# Patient Record
Sex: Female | Born: 1955 | Race: White | Hispanic: No | Marital: Married | Smoking: Former smoker
Health system: Southern US, Community
[De-identification: ages and names within clinical notes are randomized; demographics above are authoritative.]

## PROBLEM LIST (undated history)

## (undated) DIAGNOSIS — Z9109 Other allergy status, other than to drugs and biological substances: Secondary | ICD-10-CM

## (undated) DIAGNOSIS — S92909A Unspecified fracture of unspecified foot, initial encounter for closed fracture: Secondary | ICD-10-CM

## (undated) DIAGNOSIS — K649 Unspecified hemorrhoids: Secondary | ICD-10-CM

## (undated) DIAGNOSIS — E559 Vitamin D deficiency, unspecified: Secondary | ICD-10-CM

## (undated) DIAGNOSIS — B001 Herpesviral vesicular dermatitis: Secondary | ICD-10-CM

## (undated) DIAGNOSIS — O009 Unspecified ectopic pregnancy without intrauterine pregnancy: Secondary | ICD-10-CM

## (undated) HISTORY — DX: Other allergy status, other than to drugs and biological substances: Z91.09

## (undated) HISTORY — DX: Vitamin D deficiency, unspecified: E55.9

## (undated) HISTORY — DX: Unspecified ectopic pregnancy without intrauterine pregnancy: O00.90

## (undated) HISTORY — DX: Unspecified fracture of unspecified foot, initial encounter for closed fracture: S92.909A

## (undated) HISTORY — DX: Unspecified hemorrhoids: K64.9

## (undated) HISTORY — DX: Herpesviral vesicular dermatitis: B00.1

## (undated) HISTORY — PX: PELVIC LAPAROSCOPY: SHX162

---

## 1998-08-09 ENCOUNTER — Other Ambulatory Visit: Admission: RE | Admit: 1998-08-09 | Discharge: 1998-08-09 | Payer: Self-pay | Admitting: Obstetrics and Gynecology

## 1999-10-09 ENCOUNTER — Other Ambulatory Visit: Admission: RE | Admit: 1999-10-09 | Discharge: 1999-10-09 | Payer: Self-pay | Admitting: Obstetrics and Gynecology

## 2000-10-09 ENCOUNTER — Other Ambulatory Visit: Admission: RE | Admit: 2000-10-09 | Discharge: 2000-10-09 | Payer: Self-pay | Admitting: Obstetrics and Gynecology

## 2001-10-27 ENCOUNTER — Other Ambulatory Visit: Admission: RE | Admit: 2001-10-27 | Discharge: 2001-10-27 | Payer: Self-pay | Admitting: Obstetrics and Gynecology

## 2003-03-23 ENCOUNTER — Other Ambulatory Visit: Admission: RE | Admit: 2003-03-23 | Discharge: 2003-03-23 | Payer: Self-pay | Admitting: Obstetrics and Gynecology

## 2004-03-27 ENCOUNTER — Other Ambulatory Visit: Admission: RE | Admit: 2004-03-27 | Discharge: 2004-03-27 | Payer: Self-pay | Admitting: Obstetrics and Gynecology

## 2005-03-28 ENCOUNTER — Other Ambulatory Visit: Admission: RE | Admit: 2005-03-28 | Discharge: 2005-03-28 | Payer: Self-pay | Admitting: Obstetrics and Gynecology

## 2005-04-29 ENCOUNTER — Encounter: Admission: RE | Admit: 2005-04-29 | Discharge: 2005-04-29 | Payer: Self-pay | Admitting: Surgery

## 2005-07-29 ENCOUNTER — Encounter: Admission: RE | Admit: 2005-07-29 | Discharge: 2005-07-29 | Payer: Self-pay | Admitting: Surgery

## 2005-11-07 ENCOUNTER — Encounter: Admission: RE | Admit: 2005-11-07 | Discharge: 2005-11-07 | Payer: Self-pay | Admitting: Obstetrics and Gynecology

## 2006-05-12 ENCOUNTER — Other Ambulatory Visit: Admission: RE | Admit: 2006-05-12 | Discharge: 2006-05-12 | Payer: Self-pay | Admitting: Obstetrics and Gynecology

## 2006-05-14 ENCOUNTER — Encounter: Admission: RE | Admit: 2006-05-14 | Discharge: 2006-05-14 | Payer: Self-pay | Admitting: Obstetrics and Gynecology

## 2007-05-14 ENCOUNTER — Other Ambulatory Visit: Admission: RE | Admit: 2007-05-14 | Discharge: 2007-05-14 | Payer: Self-pay | Admitting: Obstetrics and Gynecology

## 2007-05-18 ENCOUNTER — Encounter: Admission: RE | Admit: 2007-05-18 | Discharge: 2007-05-18 | Payer: Self-pay | Admitting: Obstetrics and Gynecology

## 2008-05-30 ENCOUNTER — Encounter: Admission: RE | Admit: 2008-05-30 | Discharge: 2008-05-30 | Payer: Self-pay | Admitting: Obstetrics and Gynecology

## 2008-05-31 ENCOUNTER — Ambulatory Visit: Payer: Self-pay | Admitting: Obstetrics and Gynecology

## 2008-06-01 ENCOUNTER — Encounter: Payer: Self-pay | Admitting: Obstetrics and Gynecology

## 2008-06-01 ENCOUNTER — Other Ambulatory Visit: Admission: RE | Admit: 2008-06-01 | Discharge: 2008-06-01 | Payer: Self-pay | Admitting: Obstetrics and Gynecology

## 2008-06-01 ENCOUNTER — Ambulatory Visit: Payer: Self-pay | Admitting: Obstetrics and Gynecology

## 2009-07-20 ENCOUNTER — Encounter: Admission: RE | Admit: 2009-07-20 | Discharge: 2009-07-20 | Payer: Self-pay | Admitting: Obstetrics and Gynecology

## 2009-07-24 ENCOUNTER — Encounter: Payer: Self-pay | Admitting: Obstetrics and Gynecology

## 2009-07-24 ENCOUNTER — Ambulatory Visit: Payer: Self-pay | Admitting: Obstetrics and Gynecology

## 2009-07-24 ENCOUNTER — Other Ambulatory Visit: Admission: RE | Admit: 2009-07-24 | Discharge: 2009-07-24 | Payer: Self-pay | Admitting: Obstetrics and Gynecology

## 2010-07-26 ENCOUNTER — Encounter: Admission: RE | Admit: 2010-07-26 | Discharge: 2010-07-26 | Payer: Self-pay | Admitting: Obstetrics and Gynecology

## 2010-10-01 ENCOUNTER — Ambulatory Visit: Payer: Self-pay | Admitting: Obstetrics and Gynecology

## 2010-10-01 ENCOUNTER — Other Ambulatory Visit
Admission: RE | Admit: 2010-10-01 | Discharge: 2010-10-01 | Payer: Self-pay | Source: Home / Self Care | Admitting: Obstetrics and Gynecology

## 2010-10-21 ENCOUNTER — Encounter: Payer: Self-pay | Admitting: Surgery

## 2010-10-22 ENCOUNTER — Ambulatory Visit
Admission: RE | Admit: 2010-10-22 | Discharge: 2010-10-22 | Payer: Self-pay | Source: Home / Self Care | Attending: Obstetrics and Gynecology | Admitting: Obstetrics and Gynecology

## 2011-08-27 ENCOUNTER — Other Ambulatory Visit: Payer: Self-pay | Admitting: Obstetrics and Gynecology

## 2011-08-27 DIAGNOSIS — Z1231 Encounter for screening mammogram for malignant neoplasm of breast: Secondary | ICD-10-CM

## 2011-09-11 ENCOUNTER — Ambulatory Visit
Admission: RE | Admit: 2011-09-11 | Discharge: 2011-09-11 | Disposition: A | Discharge status: Home / Self Care | Payer: PRIVATE HEALTH INSURANCE | Source: Ambulatory Visit | Attending: Obstetrics and Gynecology | Admitting: Obstetrics and Gynecology

## 2011-09-11 DIAGNOSIS — Z1231 Encounter for screening mammogram for malignant neoplasm of breast: Secondary | ICD-10-CM

## 2012-07-14 ENCOUNTER — Ambulatory Visit: Payer: Commercial Managed Care - PPO | Attending: Orthopedic Surgery | Admitting: Physical Therapy

## 2012-07-14 DIAGNOSIS — IMO0001 Reserved for inherently not codable concepts without codable children: Secondary | ICD-10-CM | POA: Insufficient documentation

## 2012-07-14 DIAGNOSIS — M25669 Stiffness of unspecified knee, not elsewhere classified: Secondary | ICD-10-CM | POA: Insufficient documentation

## 2012-07-14 DIAGNOSIS — M25569 Pain in unspecified knee: Secondary | ICD-10-CM | POA: Insufficient documentation

## 2012-07-14 DIAGNOSIS — R262 Difficulty in walking, not elsewhere classified: Secondary | ICD-10-CM | POA: Insufficient documentation

## 2012-07-15 ENCOUNTER — Encounter: Payer: Self-pay | Admitting: Gynecology

## 2012-07-15 DIAGNOSIS — O009 Unspecified ectopic pregnancy without intrauterine pregnancy: Secondary | ICD-10-CM | POA: Insufficient documentation

## 2012-07-16 ENCOUNTER — Ambulatory Visit: Payer: Commercial Managed Care - PPO | Admitting: Physical Therapy

## 2012-07-20 ENCOUNTER — Ambulatory Visit: Payer: Commercial Managed Care - PPO | Admitting: Physical Therapy

## 2012-07-22 ENCOUNTER — Ambulatory Visit: Payer: Commercial Managed Care - PPO | Admitting: Physical Therapy

## 2012-07-23 ENCOUNTER — Encounter: Payer: Self-pay | Admitting: Obstetrics and Gynecology

## 2012-07-23 ENCOUNTER — Ambulatory Visit (INDEPENDENT_AMBULATORY_CARE_PROVIDER_SITE_OTHER): Payer: Commercial Managed Care - PPO | Admitting: Obstetrics and Gynecology

## 2012-07-23 VITALS — BP 122/80 | Ht 64.0 in | Wt 208.0 lb

## 2012-07-23 DIAGNOSIS — N912 Amenorrhea, unspecified: Secondary | ICD-10-CM

## 2012-07-23 DIAGNOSIS — B001 Herpesviral vesicular dermatitis: Secondary | ICD-10-CM | POA: Insufficient documentation

## 2012-07-23 DIAGNOSIS — Z23 Encounter for immunization: Secondary | ICD-10-CM

## 2012-07-23 DIAGNOSIS — Z9109 Other allergy status, other than to drugs and biological substances: Secondary | ICD-10-CM | POA: Insufficient documentation

## 2012-07-23 DIAGNOSIS — Z833 Family history of diabetes mellitus: Secondary | ICD-10-CM

## 2012-07-23 DIAGNOSIS — Z01419 Encounter for gynecological examination (general) (routine) without abnormal findings: Secondary | ICD-10-CM

## 2012-07-23 DIAGNOSIS — S92909A Unspecified fracture of unspecified foot, initial encounter for closed fracture: Secondary | ICD-10-CM | POA: Insufficient documentation

## 2012-07-23 LAB — CBC WITH DIFFERENTIAL/PLATELET
Basophils Relative: 0 % (ref 0–1)
Eosinophils Absolute: 0.1 10*3/uL (ref 0.0–0.7)
Eosinophils Relative: 2 % (ref 0–5)
HCT: 41.9 % (ref 36.0–46.0)
Hemoglobin: 13.7 g/dL (ref 12.0–15.0)
Lymphocytes Relative: 31 % (ref 12–46)
Lymphs Abs: 1.7 10*3/uL (ref 0.7–4.0)
MCH: 29.5 pg (ref 26.0–34.0)
MCV: 90.3 fL (ref 78.0–100.0)
Monocytes Absolute: 0.5 10*3/uL (ref 0.1–1.0)
Monocytes Relative: 8 % (ref 3–12)
Neutrophils Relative %: 59 % (ref 43–77)
Platelets: 177 10*3/uL (ref 150–400)
RBC: 4.64 MIL/uL (ref 3.87–5.11)
RDW: 13.8 % (ref 11.5–15.5)
WBC: 5.6 10*3/uL (ref 4.0–10.5)

## 2012-07-23 LAB — HEMOGLOBIN A1C: Hgb A1c MFr Bld: 5.9 % — ABNORMAL HIGH (ref <5.7)

## 2012-07-23 NOTE — Progress Notes (Addendum)
Patient came to see me today for her annual GYN exam. Earlier this year she was having regular cycles. She skipped June. Her last menstrual period was in July. She has been amenorrheic since then. She is having mild hot flashes that  Do not require treatment. She is not having vaginal dryness. She Uses condoms for birth control. She is up-to-date on mammograms. She had a baseline bone density in 2009 which was normal. She has always had normal Pap smears. Her last Pap smear was 2012. This summer she had a traumatic fall and had a fibular fracture which was not displaced and did not require surgery. She has been a lot of nonsteroidal anti-inflammatory drugs. She wanted her liver checked. She uses Valtrex for fever blisters occasionally. In 2012 her cholesterol test was normal here.  Physical examination:Kari Griffith present. HEENT within normal limits. Neck: Thyroid not large. No masses. Supraclavicular nodes: not enlarged. Breasts: Examined in both sitting and lying  position. No skin changes and no masses. Abdomen: Soft no guarding rebound or masses or hernia. Pelvic: External: Within normal limits. BUS: Within normal limits. Vaginal:within normal limits. Good estrogen effect. No evidence of cystocele rectocele or enterocele. Cervix: clean. Uterus: Normal size and shape. Adnexa: No masses. Rectovaginal exam: Confirmatory and negative. Extremities: Within normal limits.  Assessment: Secondary amenorrhea  Plan: FSH checked. CMP  checked due to medication use. Mammogram in December. Provera withdrawal if FSH normal. Followup bone density if FSH elevated. Pap not done.The new Pap smear guidelines were discussed with the patient.

## 2012-07-23 NOTE — Patient Instructions (Signed)
Get a mammogram in December.

## 2012-07-23 NOTE — Addendum Note (Signed)
Addended by: Dayna Barker on: 07/23/2012 11:05 AM   Modules accepted: Orders

## 2012-07-24 ENCOUNTER — Other Ambulatory Visit: Payer: Self-pay | Admitting: Obstetrics and Gynecology

## 2012-07-24 LAB — COMPREHENSIVE METABOLIC PANEL
ALT: 17 U/L (ref 0–35)
Albumin: 4.8 g/dL (ref 3.5–5.2)
Alkaline Phosphatase: 66 U/L (ref 39–117)
BUN: 17 mg/dL (ref 6–23)
CO2: 22 mEq/L (ref 19–32)
Calcium: 9.2 mg/dL (ref 8.4–10.5)
Chloride: 104 mEq/L (ref 96–112)
Creat: 0.84 mg/dL (ref 0.50–1.10)
Glucose, Bld: 82 mg/dL (ref 70–99)
Potassium: 4.3 mEq/L (ref 3.5–5.3)
Total Bilirubin: 0.7 mg/dL (ref 0.3–1.2)

## 2012-07-24 LAB — URINALYSIS W MICROSCOPIC + REFLEX CULTURE
Bacteria, UA: NONE SEEN
Casts: NONE SEEN
Crystals: NONE SEEN
Glucose, UA: NEGATIVE mg/dL
Hgb urine dipstick: NEGATIVE
Leukocytes, UA: NEGATIVE
Nitrite: NEGATIVE
Specific Gravity, Urine: >1.03 — ABNORMAL HIGH (ref 1.005–1.030)
Urobilinogen, UA: 0.2 mg/dL (ref 0.0–1.0)
pH: 6 (ref 5.0–8.0)

## 2012-07-24 LAB — FOLLICLE STIMULATING HORMONE: FSH: 15.7 m[IU]/mL

## 2012-07-24 MED ORDER — MEDROXYPROGESTERONE ACETATE 10 MG PO TABS: 10.0000 mg | ORAL_TABLET | Freq: Every day | ORAL | Status: DC

## 2012-07-28 ENCOUNTER — Encounter: Payer: Self-pay | Admitting: Obstetrics and Gynecology

## 2012-07-28 ENCOUNTER — Ambulatory Visit: Payer: Commercial Managed Care - PPO | Admitting: Rehabilitation

## 2012-07-30 ENCOUNTER — Ambulatory Visit: Payer: Commercial Managed Care - PPO | Admitting: Rehabilitation

## 2012-08-04 ENCOUNTER — Ambulatory Visit: Payer: Commercial Managed Care - PPO | Attending: Orthopedic Surgery | Admitting: Physical Therapy

## 2012-08-04 ENCOUNTER — Telehealth: Payer: Self-pay | Admitting: Obstetrics and Gynecology

## 2012-08-04 DIAGNOSIS — R262 Difficulty in walking, not elsewhere classified: Secondary | ICD-10-CM | POA: Insufficient documentation

## 2012-08-04 DIAGNOSIS — IMO0001 Reserved for inherently not codable concepts without codable children: Secondary | ICD-10-CM | POA: Insufficient documentation

## 2012-08-04 DIAGNOSIS — M25569 Pain in unspecified knee: Secondary | ICD-10-CM | POA: Insufficient documentation

## 2012-08-04 DIAGNOSIS — M25669 Stiffness of unspecified knee, not elsewhere classified: Secondary | ICD-10-CM | POA: Insufficient documentation

## 2012-08-04 NOTE — Telephone Encounter (Signed)
Patient's FSH showed she was not menopausal so she took the Provera as directed.  She started her period last night.  Patient is asking what to expect in the future.  She asked if this Provera was going to jump start things and they will be regular now.  I know there is not a way to know for sure what will happen but what would you like me to recommend--to call and report if no menses every 60 days?  Patient knows you are away and it may be next week before I call her.

## 2012-08-04 NOTE — Telephone Encounter (Signed)
There is no way to predict. She might start to have regular cycles again. If she goes over 60 days without a period she should take Provera 10 mg daily for 5 days. You can give her prescription with refills. When menopausal starts the provera will not work. Therefore if she takes provera and has no period for 2 weeks after finishing it she should come in to discuss  menopause.

## 2012-08-05 ENCOUNTER — Other Ambulatory Visit: Payer: Self-pay | Admitting: Obstetrics and Gynecology

## 2012-08-05 MED ORDER — MEDROXYPROGESTERONE ACETATE 10 MG PO TABS: 10.0000 mg | ORAL_TABLET | Freq: Every day | ORAL | Status: DC

## 2012-08-05 NOTE — Telephone Encounter (Signed)
Left message for patient to call me

## 2012-08-05 NOTE — Telephone Encounter (Signed)
Patient called.  Informed. Rx e-scribed to pharmacy.

## 2012-08-06 ENCOUNTER — Ambulatory Visit: Payer: Commercial Managed Care - PPO | Admitting: Rehabilitation

## 2012-08-10 ENCOUNTER — Ambulatory Visit: Payer: Commercial Managed Care - PPO | Admitting: Physical Therapy

## 2012-08-12 ENCOUNTER — Ambulatory Visit: Payer: Commercial Managed Care - PPO | Admitting: Physical Therapy

## 2012-08-17 ENCOUNTER — Ambulatory Visit: Payer: Commercial Managed Care - PPO | Admitting: Physical Therapy

## 2012-08-21 ENCOUNTER — Encounter: Payer: Commercial Managed Care - PPO | Admitting: Rehabilitation

## 2013-07-02 ENCOUNTER — Other Ambulatory Visit: Payer: Self-pay

## 2013-07-02 DIAGNOSIS — Z1231 Encounter for screening mammogram for malignant neoplasm of breast: Secondary | ICD-10-CM

## 2013-07-23 ENCOUNTER — Ambulatory Visit
Admission: RE | Admit: 2013-07-23 | Discharge: 2013-07-23 | Disposition: A | Discharge status: Home / Self Care | Payer: Commercial Managed Care - PPO | Source: Ambulatory Visit

## 2013-07-23 DIAGNOSIS — Z1231 Encounter for screening mammogram for malignant neoplasm of breast: Secondary | ICD-10-CM

## 2013-08-05 ENCOUNTER — Other Ambulatory Visit: Payer: Self-pay

## 2013-08-09 ENCOUNTER — Telehealth: Payer: Self-pay | Admitting: *Deleted

## 2013-08-09 NOTE — Telephone Encounter (Signed)
Pt has annual schedule on 08/20/13 states she took provera 10 mg and has not had a period 2 weeks after taking medication. Per note on 08/04/12 if this should occur pt will need to make OV,since pt has annual schedule she wanted to know if okay to wait until annual to discuss, I informed pt that would be fine.

## 2013-08-20 ENCOUNTER — Ambulatory Visit (INDEPENDENT_AMBULATORY_CARE_PROVIDER_SITE_OTHER): Payer: Commercial Managed Care - PPO | Admitting: Gynecology

## 2013-08-20 ENCOUNTER — Encounter: Payer: Self-pay | Admitting: Gynecology

## 2013-08-20 VITALS — BP 120/74 | Ht 64.0 in | Wt 215.0 lb

## 2013-08-20 DIAGNOSIS — Z01419 Encounter for gynecological examination (general) (routine) without abnormal findings: Secondary | ICD-10-CM

## 2013-08-20 DIAGNOSIS — R21 Rash and other nonspecific skin eruption: Secondary | ICD-10-CM

## 2013-08-20 DIAGNOSIS — N926 Irregular menstruation, unspecified: Secondary | ICD-10-CM

## 2013-08-20 LAB — URINALYSIS W MICROSCOPIC + REFLEX CULTURE
Bilirubin Urine: NEGATIVE
Casts: NONE SEEN
Glucose, UA: NEGATIVE mg/dL
Ketones, ur: NEGATIVE mg/dL
Nitrite: NEGATIVE
Protein, ur: NEGATIVE mg/dL
Specific Gravity, Urine: 1.029 (ref 1.005–1.030)
Urobilinogen, UA: 0.2 mg/dL (ref 0.0–1.0)
pH: 6 (ref 5.0–8.0)

## 2013-08-20 LAB — FOLLICLE STIMULATING HORMONE: FSH: 38.8 m[IU]/mL

## 2013-08-20 MED ORDER — NYSTATIN-TRIAMCINOLONE 100000-0.1 UNIT/GM-% EX OINT: 1.0000 "application " | TOPICAL_OINTMENT | Freq: Two times a day (BID) | CUTANEOUS | Status: DC

## 2013-08-20 NOTE — Patient Instructions (Addendum)
Office will call you with the hormone results. Report if you have any prolonged or atypical bleeding. Try the prescribed cream on the rash between the breasts to see if that does not help. Followup in one year for annual exam.

## 2013-08-20 NOTE — Progress Notes (Signed)
This is well below one year if Kari Griffith 12-31-1955 [id number]        57 y.o.  [id number] for annual exam.  Former patient of Dr. Eda Paschal. Several tissues noted below.  Past medical history,surgical history, problem list, medications, allergies, family history and social history were all reviewed and documented in the EPIC chart.  ROS:  Performed and pertinent positives and negatives are included in the history, assessment and plan .  Exam: [name] assistant Filed Vitals:   08/20/13 0822  BP: 120/74  Height: 5\' 4"  (1.626 m)  Weight: 215 lb (97.523 kg)   General appearance  Normal Skin erythematous rash between her breasts questionable fungal versus eczema. Erythematous cracking rash both hands consistent with eczema Head/Neck normal with no cervical or supraclavicular adenopathy thyroid normal Lungs  clear Cardiac RR, without RMG Abdominal  soft, nontender, without masses, organomegaly or hernia Breasts  examined lying and sitting without masses, retractions, discharge or axillary adenopathy. Both nipples indented as always has been Pelvic  Ext/BUS/vagina  normal  Cervix  normal  Uterus  anteverted, normal size, shape and contour, midline and mobile nontender   Adnexa  Without masses or tenderness    Anus and perineum  normal   Rectovaginal  normal sphincter tone without palpated masses or tenderness.    Assessment/Plan:  57 y.o. G6P0050 female for annual exam.   1. Irregular menses. Patient notes over the past year her menses have become a little more irregular. Dr. Eda Paschal had given her Provera to take if she did not have a menses in 60 days. She's used it several times this past year. Her LMP is August. She took a course of Provera in October but did not have a withdrawal bleed. Not having significant hot flashes night sweats vaginal dryness or dyspareunia. Will check baseline FSH. If elevated then will keep menstrual calendar and symptom calendar. As long as less frequent but  regular menses we'll follow. Prolonged or atypical bleeding or dose more than one year without a period and then bleeds she'll call us. If FSH is normal she'll continue with every other month Provera withdrawal. 2. Skin rash. Patient has an erythematous skin rash between her breasts. Also has eczema on her hands. Prescribed Mytrex to try between the breast to see if it does not get better if it is fungal related. She is actively seeing a dermatologist and will continue to follow up with them. 3. Contraception. Using condoms. Has been counseled multiple times by Dr. Eda Paschal in the past for alternatives and declines. 4. Mammography 06/2013. Continue with annual mammography. SBE monthly reviewed. 5. Pap smear 2012. No Pap smear done today. No history of abnormal Pap smears. Plan repeat Pap smear next year at 3 year interval. 6. DEXA 2009 normal. Still menstruating. Plan repeat DEXA at 62. Increased calcium reviewed. Recently had vitamin D level checked at her primary physician's office and was low. She is on 50,000 units weekly now and will followup with her primary in reference to this. 7. Colonoscopy 2010. Repeat at their recommended interval. 8. Health maintenance. No routine blood work done as it recently was done at her primary physician's office. She has a marginally elevated glucose and LDL. This is already been discussed with her by her primary. Followup one year, sooner as needed.   Note: This document was prepared with digital dictation and possible smart phrase technology. Any transcriptional errors that result from this process are unintentional.   Dara Lords MD, 8:42 AM 08/20/2013

## 2013-08-21 LAB — URINE CULTURE: Colony Count: 75000

## 2013-09-06 ENCOUNTER — Encounter: Payer: Self-pay | Admitting: Gynecology

## 2014-07-14 ENCOUNTER — Other Ambulatory Visit: Payer: Self-pay

## 2014-07-14 DIAGNOSIS — Z1231 Encounter for screening mammogram for malignant neoplasm of breast: Secondary | ICD-10-CM

## 2014-08-01 ENCOUNTER — Encounter: Payer: Self-pay | Admitting: Gynecology

## 2014-08-03 ENCOUNTER — Ambulatory Visit
Admission: RE | Admit: 2014-08-03 | Discharge: 2014-08-03 | Disposition: A | Discharge status: Home / Self Care | Payer: Commercial Managed Care - PPO | Source: Ambulatory Visit

## 2014-08-03 DIAGNOSIS — Z1231 Encounter for screening mammogram for malignant neoplasm of breast: Secondary | ICD-10-CM

## 2014-08-22 ENCOUNTER — Other Ambulatory Visit (HOSPITAL_COMMUNITY)
Admission: RE | Admit: 2014-08-22 | Discharge: 2014-08-22 | Disposition: A | Discharge status: Home / Self Care | Payer: Commercial Managed Care - PPO | Source: Ambulatory Visit | Attending: Gynecology | Admitting: Gynecology

## 2014-08-22 ENCOUNTER — Ambulatory Visit (INDEPENDENT_AMBULATORY_CARE_PROVIDER_SITE_OTHER): Payer: Commercial Managed Care - PPO | Admitting: Gynecology

## 2014-08-22 ENCOUNTER — Encounter: Payer: Self-pay | Admitting: Gynecology

## 2014-08-22 VITALS — BP 130/78 | Ht 64.5 in | Wt 211.0 lb

## 2014-08-22 DIAGNOSIS — Z1151 Encounter for screening for human papillomavirus (HPV): Secondary | ICD-10-CM | POA: Insufficient documentation

## 2014-08-22 DIAGNOSIS — N926 Irregular menstruation, unspecified: Secondary | ICD-10-CM

## 2014-08-22 DIAGNOSIS — Z01419 Encounter for gynecological examination (general) (routine) without abnormal findings: Secondary | ICD-10-CM

## 2014-08-22 NOTE — Patient Instructions (Signed)
You may obtain a copy of any labs that were done today by logging onto MyChart as outlined in the instructions provided with your AVS (after visit summary). The office will not call with normal lab results but certainly if there are any significant abnormalities then we will contact you.   Health Maintenance, Female A healthy lifestyle and preventative care can promote health and wellness.  Maintain regular health, dental, and eye exams.  Eat a healthy diet. Foods like vegetables, fruits, whole grains, low-fat dairy products, and lean protein foods contain the nutrients you need without too many calories. Decrease your intake of foods high in solid fats, added sugars, and salt. Get information about a proper diet from your caregiver, if necessary.  Regular physical exercise is one of the most important things you can do for your health. Most adults should get at least 150 minutes of moderate-intensity exercise (any activity that increases your heart rate and causes you to sweat) each week. In addition, most adults need muscle-strengthening exercises on 2 or more days a week.   Maintain a healthy weight. The body mass index (BMI) is a screening tool to identify possible weight problems. It provides an estimate of body fat based on height and weight. Your caregiver can help determine your BMI, and can help you achieve or maintain a healthy weight. For adults 20 years and older:  A BMI below 18.5 is considered underweight.  A BMI of 18.5 to 24.9 is normal.  A BMI of 25 to 29.9 is considered overweight.  A BMI of 30 and above is considered obese.  Maintain normal blood lipids and cholesterol by exercising and minimizing your intake of saturated fat. Eat a balanced diet with plenty of fruits and vegetables. Blood tests for lipids and cholesterol should begin at age 61 and be repeated every 5 years. If your lipid or cholesterol levels are high, you are over 50, or you are a high risk for heart  disease, you may need your cholesterol levels checked more frequently.Ongoing high lipid and cholesterol levels should be treated with medicines if diet and exercise are not effective.  If you smoke, find out from your caregiver how to quit. If you do not use tobacco, do not start.  Lung cancer screening is recommended for adults aged 33 80 years who are at high risk for developing lung cancer because of a history of smoking. Yearly low-dose computed tomography (CT) is recommended for people who have at least a 30-pack-year history of smoking and are a current smoker or have quit within the past 15 years. A pack year of smoking is smoking an average of 1 pack of cigarettes a day for 1 year (for example: 1 pack a day for 30 years or 2 packs a day for 15 years). Yearly screening should continue until the smoker has stopped smoking for at least 15 years. Yearly screening should also be stopped for people who develop a health problem that would prevent them from having lung cancer treatment.  If you are pregnant, do not drink alcohol. If you are breastfeeding, be very cautious about drinking alcohol. If you are not pregnant and choose to drink alcohol, do not exceed 1 drink per day. One drink is considered to be 12 ounces (355 mL) of beer, 5 ounces (148 mL) of wine, or 1.5 ounces (44 mL) of liquor.  Avoid use of street drugs. Do not share needles with anyone. Ask for help if you need support or instructions about stopping  the use of drugs.  High blood pressure causes heart disease and increases the risk of stroke. Blood pressure should be checked at least every 1 to 2 years. Ongoing high blood pressure should be treated with medicines, if weight loss and exercise are not effective.  If you are 59 to 56 years old, ask your caregiver if you should take aspirin to prevent strokes.  Diabetes screening involves taking a blood sample to check your fasting blood sugar level. This should be done once every 3  years, after age 91, if you are within normal weight and without risk factors for diabetes. Testing should be considered at a younger age or be carried out more frequently if you are overweight and have at least 1 risk factor for diabetes.  Breast cancer screening is essential preventative care for women. You should practice "breast self-awareness." This means understanding the normal appearance and feel of your breasts and may include breast self-examination. Any changes detected, no matter how small, should be reported to a caregiver. Women in their 66s and 30s should have a clinical breast exam (CBE) by a caregiver as part of a regular health exam every 1 to 3 years. After age 101, women should have a CBE every year. Starting at age 100, women should consider having a mammogram (breast X-ray) every year. Women who have a family history of breast cancer should talk to their caregiver about genetic screening. Women at a high risk of breast cancer should talk to their caregiver about having an MRI and a mammogram every year.  Breast cancer gene (BRCA)-related cancer risk assessment is recommended for women who have family members with BRCA-related cancers. BRCA-related cancers include breast, ovarian, tubal, and peritoneal cancers. Having family members with these cancers may be associated with an increased risk for harmful changes (mutations) in the breast cancer genes BRCA1 and BRCA2. Results of the assessment will determine the need for genetic counseling and BRCA1 and BRCA2 testing.  The Pap test is a screening test for cervical cancer. Women should have a Pap test starting at age 57. Between ages 25 and 35, Pap tests should be repeated every 2 years. Beginning at age 37, you should have a Pap test every 3 years as long as the past 3 Pap tests have been normal. If you had a hysterectomy for a problem that was not cancer or a condition that could lead to cancer, then you no longer need Pap tests. If you are  between ages 50 and 76, and you have had normal Pap tests going back 10 years, you no longer need Pap tests. If you have had past treatment for cervical cancer or a condition that could lead to cancer, you need Pap tests and screening for cancer for at least 20 years after your treatment. If Pap tests have been discontinued, risk factors (such as a new sexual partner) need to be reassessed to determine if screening should be resumed. Some women have medical problems that increase the chance of getting cervical cancer. In these cases, your caregiver may recommend more frequent screening and Pap tests.  The human papillomavirus (HPV) test is an additional test that may be used for cervical cancer screening. The HPV test looks for the virus that can cause the cell changes on the cervix. The cells collected during the Pap test can be tested for HPV. The HPV test could be used to screen women aged 44 years and older, and should be used in women of any age  who have unclear Pap test results. After the age of 55, women should have HPV testing at the same frequency as a Pap test.  Colorectal cancer can be detected and often prevented. Most routine colorectal cancer screening begins at the age of 44 and continues through age 20. However, your caregiver may recommend screening at an earlier age if you have risk factors for colon cancer. On a yearly basis, your caregiver may provide home test kits to check for hidden blood in the stool. Use of a small camera at the end of a tube, to directly examine the colon (sigmoidoscopy or colonoscopy), can detect the earliest forms of colorectal cancer. Talk to your caregiver about this at age 86, when routine screening begins. Direct examination of the colon should be repeated every 5 to 10 years through age 13, unless early forms of pre-cancerous polyps or small growths are found.  Hepatitis C blood testing is recommended for all people born from [birth year] through 1963 and any  individual with known risks for hepatitis C.  Practice safe sex. Use condoms and avoid high-risk sexual practices to reduce the spread of sexually transmitted infections (STIs). Sexually active women aged 36 and younger should be checked for Chlamydia, which is a common sexually transmitted infection. Older women with new or multiple partners should also be tested for Chlamydia. Testing for other STIs is recommended if you are sexually active and at increased risk.  Osteoporosis is a disease in which the bones lose minerals and strength with aging. This can result in serious bone fractures. The risk of osteoporosis can be identified using a bone density scan. Women ages 20 and over and women at risk for fractures or osteoporosis should discuss screening with their caregivers. Ask your caregiver whether you should be taking a calcium supplement or vitamin D to reduce the rate of osteoporosis.  Menopause can be associated with physical symptoms and risks. Hormone replacement therapy is available to decrease symptoms and risks. You should talk to your caregiver about whether hormone replacement therapy is right for you.  Use sunscreen. Apply sunscreen liberally and repeatedly throughout the day. You should seek shade when your shadow is shorter than you. Protect yourself by wearing long sleeves, pants, a wide-brimmed hat, and sunglasses year round, whenever you are outdoors.  Notify your caregiver of new moles or changes in moles, especially if there is a change in shape or color. Also notify your caregiver if a mole is larger than the size of a pencil eraser.  Stay current with your immunizations. Document Released: 04/01/2011 Document Revised: 01/11/2013 Document Reviewed: 04/01/2011 [facility] Patient Information 2014 [employer].

## 2014-08-22 NOTE — Progress Notes (Signed)
Kari LeydenDebra I Griffith 08/23/1956 [id number]        58 y.o.  [id number] for annual exam.  Several issues noted below.  Past medical history,surgical history, problem list, medications, allergies, family history and social history were all reviewed and documented as reviewed in the EPIC chart.  ROS:  12 system ROS performed with pertinent positives and negatives included in the history, assessment and plan.   Additional significant findings :  none   Exam: [name] personassistant Filed Vitals:   08/22/14 0816  BP: 130/78  Height: 5' 4.5" (1.638 m)  Weight: 211 lb (95.709 kg)   General appearance:  Normal affect, orientation and appearance. Skin: Grossly normal HEENT: Without gross lesions.  No cervical or supraclavicular adenopathy. Thyroid normal.  Lungs:  Clear without wheezing, rales or rhonchi Cardiac: RR, without RMG Abdominal:  Soft, nontender, without masses, guarding, rebound, organomegaly or hernia Breasts:  Examined lying and sitting without masses, retractions, discharge or axillary adenopathy. Pelvic:  Ext/BUS/vagina normal  Cervix normal. Pap/HPV  Uterus anteverted, normal size, shape and contour, midline and mobile nontender   Adnexa  Without masses or tenderness    Anus and perineum  Normal   Rectovaginal  Normal sphincter tone without palpated masses or tenderness.    Assessment/Plan:  58 y.o. 415P0050 female for annual exam with irregular menses, condom contraception.   1. Irregular menses. Patient's menses are spacing out. LMP 04/2014. Not having significant hot flashes or night sweats. FSH last year was 38.  Will keep menstrual calendar. His longest less frequent but regular with a occur will monitor. If she goes more than 1 year without menses and then bleeds or prolonged or atypical bleeding patient knows to follow up for evaluation. 2. Contraception. I emphasized the need to continue with contraception until she is 1 year without menses. 3. Pap smear 2012.  Pap/HPV today. No  history of significant abnormal Pap smears.  Repeat at 3-5 year interval assuming this Pap smear is normal per current screening guidelines. 4. DEXA 2009 normal. Repeat at age 58. History of vitamin D deficiency.  Is supplementing with OTC vitamin D. Check vitamin D level today. 5. Mammography 07/2014. Continue with annual mammography. SBE monthly reviewed. 6. Colonoscopy 2010. Repeat at their recommended interval. 7. Health maintenance. No routine blood work done as she has this done at her primary physician's office. Follow up 1 year, sooner as needed     Kari Griffith,TIMOTHY P MD, 8:40 AM 08/22/2014

## 2014-08-22 NOTE — Addendum Note (Signed)
Addended by: Dayna BarkerGARDNER, Barnes Florek K on: 08/22/2014 08:47 AM   Modules accepted: Orders, SmartSet

## 2014-08-23 LAB — URINALYSIS W MICROSCOPIC + REFLEX CULTURE
BILIRUBIN URINE: NEGATIVE
CRYSTALS: NONE SEEN
Casts: NONE SEEN
GLUCOSE, UA: NEGATIVE mg/dL
Hgb urine dipstick: NEGATIVE
Ketones, ur: NEGATIVE mg/dL
LEUKOCYTES UA: NEGATIVE
Nitrite: NEGATIVE
PROTEIN: NEGATIVE mg/dL
SPECIFIC GRAVITY, URINE: 1.023 (ref 1.005–1.030)
Urobilinogen, UA: 0.2 mg/dL (ref 0.0–1.0)
pH: 5.5 (ref 5.0–8.0)

## 2014-08-23 LAB — VITAMIN D 25 HYDROXY (VIT D DEFICIENCY, FRACTURES): Vit D, 25-Hydroxy: 38 ng/mL (ref 30–100)

## 2014-08-23 LAB — CYTOLOGY - PAP

## 2014-08-27 LAB — URINE CULTURE

## 2014-08-29 ENCOUNTER — Other Ambulatory Visit: Payer: Self-pay | Admitting: Gynecology

## 2014-08-29 MED ORDER — SULFAMETHOXAZOLE-TRIMETHOPRIM 800-160 MG PO TABS: 1.0000 | ORAL_TABLET | Freq: Two times a day (BID) | ORAL | Status: DC

## 2015-08-23 ENCOUNTER — Other Ambulatory Visit: Payer: Self-pay

## 2015-08-23 DIAGNOSIS — Z1231 Encounter for screening mammogram for malignant neoplasm of breast: Secondary | ICD-10-CM

## 2015-08-28 ENCOUNTER — Encounter: Payer: Self-pay | Admitting: Gynecology

## 2015-08-28 ENCOUNTER — Ambulatory Visit (INDEPENDENT_AMBULATORY_CARE_PROVIDER_SITE_OTHER): Payer: Commercial Managed Care - PPO | Admitting: Gynecology

## 2015-08-28 VITALS — BP 124/80 | Ht 64.0 in | Wt 218.0 lb

## 2015-08-28 DIAGNOSIS — Z01419 Encounter for gynecological examination (general) (routine) without abnormal findings: Secondary | ICD-10-CM | POA: Diagnosis not present

## 2015-08-28 NOTE — Patient Instructions (Signed)

## 2015-08-28 NOTE — Progress Notes (Signed)
Ralph LeydenDebra I Shoemaker 07/02/1956 [id number]        59 y.o.  [id number]  Patient's last menstrual period was 04/30/2014. for annual exam.  Doing well.  Past medical history,surgical history, problem list, medications, allergies, family history and social history were all reviewed and documented as reviewed in the EPIC chart.  ROS:  Performed with pertinent positives and negatives included in the history, assessment and plan.   Additional significant findings :  none   Exam: Delena ServeKim Alexis assistant Filed Vitals:   08/28/15 0807  BP: 124/80  Height: 5\' 4"  (1.626 m)  Weight: 218 lb (98.884 kg)   General appearance:  Normal affect, orientation and appearance. Skin: Grossly normal HEENT: Without gross lesions.  No cervical or supraclavicular adenopathy. Thyroid normal.  Lungs:  Clear without wheezing, rales or rhonchi Cardiac: RR, without RMG Abdominal:  Soft, nontender, without masses, guarding, rebound, organomegaly or hernia Breasts:  Examined lying and sitting without masses, retractions, discharge or axillary adenopathy. Pelvic:  Ext/BUS/vagina normal  Cervix normal  Uterus anteverted, normal size, shape and contour, midline and mobile nontender   Adnexa  Without masses or tenderness    Anus and perineum  Normal   Rectovaginal  Normal sphincter tone without palpated masses or tenderness.    Assessment/Plan:  59 y.o. 275P0050 female for annual exam.   1. Postmenopausal. Patient one year without menses. Not having significant hot flushes, night sweats, vaginal dryness. Continue to monitor and report any issues or bleeding. Discussed the issues of HRT and she is not significantly symptomatic we both agree to monitor at present. 2. Pap smear/HPV 2015 negative. No Pap smear done today. No history of abnormal Pap smears previously. 3. DEXA 2009 normal. Plan repeat DEXA at age 59.  Vitamin D 38 last year. She was not on vitamin D at that time. She is on 1000 units supplemental now. 4. Mammography  scheduled in December. SBE monthly reviewed. 5. Health maintenance. No routine blood work done as patient reports this done at her primary physician's office. Follow up in one year, sooner as needed.   Dara LordsFONTAINE,Kanyah Matsushima P MD, 8:42 AM 08/28/2015

## 2015-08-29 LAB — URINALYSIS W MICROSCOPIC + REFLEX CULTURE
Bacteria, UA: NONE SEEN [HPF]
Bilirubin Urine: NEGATIVE
CRYSTALS: NONE SEEN [HPF]
Casts: NONE SEEN [LPF]
GLUCOSE, UA: NEGATIVE
KETONES UR: NEGATIVE
LEUKOCYTES UA: NEGATIVE
Nitrite: NEGATIVE
PROTEIN: NEGATIVE
Specific Gravity, Urine: 1.027 (ref 1.001–1.035)
Yeast: NONE SEEN [HPF]
pH: 5.5 (ref 5.0–8.0)

## 2015-08-30 ENCOUNTER — Other Ambulatory Visit: Payer: Self-pay | Admitting: Gynecology

## 2015-08-30 DIAGNOSIS — R3129 Other microscopic hematuria: Secondary | ICD-10-CM

## 2015-08-30 LAB — URINE CULTURE
COLONY COUNT: NO GROWTH
Organism ID, Bacteria: NO GROWTH

## 2015-09-13 ENCOUNTER — Ambulatory Visit
Admission: RE | Admit: 2015-09-13 | Discharge: 2015-09-13 | Disposition: A | Discharge status: Home / Self Care | Payer: Commercial Managed Care - PPO | Source: Ambulatory Visit

## 2015-09-13 DIAGNOSIS — Z1231 Encounter for screening mammogram for malignant neoplasm of breast: Secondary | ICD-10-CM

## 2015-09-29 ENCOUNTER — Other Ambulatory Visit: Payer: Commercial Managed Care - PPO

## 2015-09-29 DIAGNOSIS — R3129 Other microscopic hematuria: Secondary | ICD-10-CM

## 2015-09-29 LAB — URINALYSIS W MICROSCOPIC + REFLEX CULTURE
Bacteria, UA: NONE SEEN [HPF]
Bilirubin Urine: NEGATIVE
CASTS: NONE SEEN [LPF]
Crystals: NONE SEEN [HPF]
Glucose, UA: NEGATIVE
Ketones, ur: NEGATIVE
Leukocytes, UA: NEGATIVE
NITRITE: NEGATIVE
PH: 6 (ref 5.0–8.0)
Protein, ur: NEGATIVE
SPECIFIC GRAVITY, URINE: 1.01 (ref 1.001–1.035)
Squamous Epithelial / LPF: NONE SEEN [HPF] (ref <=5)
WBC, UA: NONE SEEN WBC/HPF (ref <=5)
Yeast: NONE SEEN [HPF]

## 2015-09-30 LAB — URINE CULTURE
Colony Count: NO GROWTH
ORGANISM ID, BACTERIA: NO GROWTH

## 2015-10-04 ENCOUNTER — Telehealth: Payer: Self-pay

## 2015-10-04 NOTE — Telephone Encounter (Signed)
Patient informed. 

## 2015-10-04 NOTE — Telephone Encounter (Signed)
Patient called to check on her u/a results.  She had a recheck 09/29/15 on her u/a from 08/28/15.  The first u/a had 3-10 RBC's and 1+. The most recent result is 0-2 with a trace of blood.  What to tell patient about result?

## 2015-10-04 NOTE — Telephone Encounter (Signed)
Looks fine. Blood cells cleared.

## 2016-08-28 ENCOUNTER — Encounter: Payer: Self-pay | Admitting: Gynecology

## 2016-08-28 ENCOUNTER — Ambulatory Visit (INDEPENDENT_AMBULATORY_CARE_PROVIDER_SITE_OTHER): Payer: Managed Care, Other (non HMO) | Admitting: Gynecology

## 2016-08-28 VITALS — BP 122/74 | Ht 64.0 in | Wt 222.0 lb

## 2016-08-28 DIAGNOSIS — N952 Postmenopausal atrophic vaginitis: Secondary | ICD-10-CM

## 2016-08-28 DIAGNOSIS — Z01411 Encounter for gynecological examination (general) (routine) with abnormal findings: Secondary | ICD-10-CM

## 2016-08-28 NOTE — Progress Notes (Signed)
    Ralph LeydenDebra I Granlund 04/04/1956 [id number]        60 y.o.  G5P0050  for annual exam.    Past medical history,surgical history, problem list, medications, allergies, family history and social history were all reviewed and documented as reviewed in the EPIC chart.  ROS:  Performed with pertinent positives and negatives included in the history, assessment and plan.   Additional significant findings :  None   Exam: Kennon PortelaKim Gardner assistant Vitals:   08/28/16 0801  BP: 122/74  Weight: 222 lb (100.7 kg)  Height: 5\' 4"  (1.626 m)   Body mass index is 38.11 kg/m.  General appearance:  Normal affect, orientation and appearance. Skin: Grossly normal HEENT: Without gross lesions.  No cervical or supraclavicular adenopathy. Thyroid normal.  Lungs:  Clear without wheezing, rales or rhonchi Cardiac: RR, without RMG Abdominal:  Soft, nontender, without masses, guarding, rebound, organomegaly or hernia Breasts:  Examined lying and sitting without masses, retractions, discharge or axillary adenopathy. Pelvic:  Ext, BUS, Vagina with atrophic changes  Cervix normal  Uterus anteverted, normal size, shape and contour, midline and mobile nontender   Adnexa without masses or tenderness    Anus and perineum normal   Rectovaginal normal sphincter tone without palpated masses or tenderness.    Assessment/Plan:  60 y.o. 605P0050 female for annual exam.   1. Postmenopausal/atrophic genital changes. No significant hot flushes, night sweats, vaginal dryness or any vaginal bleeding. Continue to monitor and report any issues or bleeding. 2. Pap smear/HPV 2015 negative.  No Pap smear done today. No history of significant abnormal Pap smears. Plan repeat Pap smear at 5 year interval per current screening guidelines. 3. DEXA 2009 normal. Plan repeat DEXA at age [age]. Is having vitamin D level checked next week at her primary physician's office. 4. Mammography coming due this month and patient will schedule. SBE  monthly reviewed. 5. Colonoscopy 2010. Reported repeat interval 10 years. 6. Health maintenance. No routine lab work done as patient is having it done this week at her primary physician's office. Follow up 1 year, sooner as needed.   Dara LordsFONTAINE,Brazos Sandoval P MD, 8:17 AM 08/28/2016

## 2016-08-28 NOTE — Patient Instructions (Signed)

## 2016-11-22 ENCOUNTER — Other Ambulatory Visit: Payer: Self-pay | Admitting: Gynecology

## 2016-11-22 DIAGNOSIS — Z1231 Encounter for screening mammogram for malignant neoplasm of breast: Secondary | ICD-10-CM

## 2016-12-10 ENCOUNTER — Ambulatory Visit: Payer: Commercial Managed Care - PPO

## 2016-12-25 ENCOUNTER — Ambulatory Visit
Admission: RE | Admit: 2016-12-25 | Discharge: 2016-12-25 | Disposition: A | Discharge status: Home / Self Care | Payer: Managed Care, Other (non HMO) | Source: Ambulatory Visit | Attending: Gynecology | Admitting: Gynecology

## 2016-12-25 DIAGNOSIS — Z1231 Encounter for screening mammogram for malignant neoplasm of breast: Secondary | ICD-10-CM

## 2016-12-26 ENCOUNTER — Encounter: Payer: Self-pay | Admitting: Gynecology

## 2017-02-12 ENCOUNTER — Encounter: Payer: Self-pay | Admitting: Gynecology

## 2017-08-29 ENCOUNTER — Ambulatory Visit (INDEPENDENT_AMBULATORY_CARE_PROVIDER_SITE_OTHER): Payer: Managed Care, Other (non HMO) | Admitting: Gynecology

## 2017-08-29 ENCOUNTER — Encounter: Payer: Self-pay | Admitting: Gynecology

## 2017-08-29 VITALS — BP 140/88 | Ht 64.0 in | Wt 227.0 lb

## 2017-08-29 DIAGNOSIS — N952 Postmenopausal atrophic vaginitis: Secondary | ICD-10-CM

## 2017-08-29 DIAGNOSIS — Z01411 Encounter for gynecological examination (general) (routine) with abnormal findings: Secondary | ICD-10-CM | POA: Diagnosis not present

## 2017-08-29 NOTE — Progress Notes (Signed)
    Ralph LeydenDebra I Medero 12/17/1955 [id number]        61 y.o.  G5P0050 for annual gynecologic exam.  Doing well without gynecologic complaints.  Past medical history,surgical history, problem list, medications, allergies, family history and social history were all reviewed and documented as reviewed in the EPIC chart.  ROS:  Performed with pertinent positives and negatives included in the history, assessment and plan.   Additional significant findings : None   Exam: Kennon PortelaKim Gardner assistant Vitals:   08/29/17 0816  BP: 140/88  Weight: 227 lb (103 kg)  Height: 5\' 4"  (1.626 m)   Body mass index is 38.96 kg/m.  General appearance:  Normal affect, orientation and appearance. Skin: Grossly normal HEENT: Without gross lesions.  No cervical or supraclavicular adenopathy. Thyroid normal.  Lungs:  Clear without wheezing, rales or rhonchi Cardiac: RR, without RMG Abdominal:  Soft, nontender, without masses, guarding, rebound, organomegaly or hernia Breasts:  Examined lying and sitting without masses, retractions, discharge or axillary adenopathy. Pelvic:  Ext, BUS, Vagina: Normal with mild atrophic changes  Cervix: Normal  Uterus: Anteverted, normal size, shape and contour, midline and mobile nontender   Adnexa: Without masses or tenderness    Anus and perineum: Normal   Rectovaginal: Normal sphincter tone without palpated masses or tenderness.    Assessment/Plan:  61 y.o. 695P0050 female for annual gynecologic exam.   1. Postmenopausal/atrophic genital changes.  No significant hot flushes, night sweats, vaginal dryness or any vaginal bleeding.  Report any issues or bleeding. 2. Pap smear/HPV 07/2014.  No Pap smear done today.  No history of significant abnormal Pap smears.  Plan repeat Pap smear at 5-year interval per current screening guidelines. 3. DEXA 2009 normal.  Plan repeat DEXA next year at age 61. 4. Mammography 11/2016.  Continue with annual mammography when due.  Breast exam normal  today.  SBE monthly reviewed. 5. Colonoscopy 2010.  Repeat at their recommended interval. 6. Health maintenance.  Blood pressure noted at 140/88.  Patient has upcoming annual exam scheduled beginning of January.  She will follow-up with her primary in reference to this.  She will recheck her blood pressure before hand to make sure it is in the normal range in a non-exam situation.  No routine blood work done as she is going to have this done at her primary physician's appointment.  Follow-up in 1 year, sooner as needed.   Dara Lordsimothy P Lambert Jeanty MD, 8:34 AM 08/29/2017

## 2017-08-29 NOTE — Patient Instructions (Signed)
Follow up in one year for annual exam 

## 2017-09-10 ENCOUNTER — Ambulatory Visit: Payer: Managed Care, Other (non HMO) | Admitting: Physician Assistant

## 2018-02-16 ENCOUNTER — Other Ambulatory Visit: Payer: Self-pay | Admitting: Gynecology

## 2018-02-16 DIAGNOSIS — Z1231 Encounter for screening mammogram for malignant neoplasm of breast: Secondary | ICD-10-CM

## 2018-03-16 ENCOUNTER — Ambulatory Visit
Admission: RE | Admit: 2018-03-16 | Discharge: 2018-03-16 | Disposition: A | Discharge status: Home / Self Care | Payer: Managed Care, Other (non HMO) | Source: Ambulatory Visit | Attending: Gynecology | Admitting: Gynecology

## 2018-03-16 DIAGNOSIS — Z1231 Encounter for screening mammogram for malignant neoplasm of breast: Secondary | ICD-10-CM

## 2018-09-04 ENCOUNTER — Encounter: Payer: Self-pay | Admitting: Gynecology

## 2018-09-04 ENCOUNTER — Ambulatory Visit (INDEPENDENT_AMBULATORY_CARE_PROVIDER_SITE_OTHER): Payer: Managed Care, Other (non HMO) | Admitting: Gynecology

## 2018-09-04 VITALS — BP 120/82 | Ht 64.0 in | Wt 233.0 lb

## 2018-09-04 DIAGNOSIS — Z01419 Encounter for gynecological examination (general) (routine) without abnormal findings: Secondary | ICD-10-CM

## 2018-09-04 DIAGNOSIS — N952 Postmenopausal atrophic vaginitis: Secondary | ICD-10-CM

## 2018-09-04 NOTE — Progress Notes (Signed)
    Kari LeydenDebra I Griffith 04/28/1956 [id number]        62 y.o.  [id number] for annual gynecologic exam.  Doing well without gynecologic complaints  Past medical history,surgical history, problem list, medications, allergies, family history and social history were all reviewed and documented as reviewed in the EPIC chart.  ROS:  Performed with pertinent positives and negatives included in the history, assessment and plan.   Additional significant findings : None   Exam: Bari MantisKim Alexis assistant Vitals:   09/04/18 0818  BP: 120/82  Weight: 233 lb (105.7 kg)  Height: 5\' 4"  (1.626 m)   Body mass index is 39.99 kg/m.  General appearance:  Normal affect, orientation and appearance. Skin: Grossly normal HEENT: Without gross lesions.  No cervical or supraclavicular adenopathy. Thyroid normal.  Lungs:  Clear without wheezing, rales or rhonchi Cardiac: RR, without RMG Abdominal:  Soft, nontender, without masses, guarding, rebound, organomegaly or hernia Breasts:  Examined lying and sitting without masses, retractions, discharge or axillary adenopathy. Pelvic:  Ext, BUS, Vagina: Normal with atrophic changes  Cervix: Normal with atrophic changes.  Pap smear/HPV  Uterus: Anteverted, normal size, shape and contour, midline and mobile nontender   Adnexa: Without masses or tenderness    Anus and perineum: Normal   Rectovaginal: Normal sphincter tone without palpated masses or tenderness.    Assessment/Plan:  62 y.o. 585P0050 female for annual gynecologic exam.   1. Postmenopausal/atrophic genital changes.  No significant menopausal symptoms or any vaginal bleeding. 2. Pap smear/HPV 2015.  Pap smear/HPV today.  No history of significant abnormal Pap smears. 3. Mammography 02/2018.  Continue with annual mammography when due.  Breast exam normal today. 4. Colonoscopy due now and patient will arrange. 5. DEXA 2009 normal.  Schedule DEXA now at 10-year interval at age 62. 6. Health maintenance.  No routine  lab work done as patient does this elsewhere.  Follow-up 1 year, sooner as needed.   Dara Lordsimothy P Fontaine MD, 8:39 AM 09/04/2018

## 2018-09-04 NOTE — Patient Instructions (Signed)
Followup for bone density as scheduled. 

## 2018-09-08 LAB — PAP, TP IMAGING W/ HPV RNA, RFLX HPV TYPE 16,18/45: HPV DNA High Risk: NOT DETECTED

## 2018-10-05 ENCOUNTER — Ambulatory Visit (INDEPENDENT_AMBULATORY_CARE_PROVIDER_SITE_OTHER): Payer: Managed Care, Other (non HMO)

## 2018-10-05 ENCOUNTER — Other Ambulatory Visit: Payer: Self-pay | Admitting: Gynecology

## 2018-10-05 DIAGNOSIS — Z13820 Encounter for screening for osteoporosis: Secondary | ICD-10-CM

## 2018-10-05 DIAGNOSIS — Z01419 Encounter for gynecological examination (general) (routine) without abnormal findings: Secondary | ICD-10-CM

## 2018-10-05 DIAGNOSIS — Z780 Asymptomatic menopausal state: Secondary | ICD-10-CM

## 2018-10-07 ENCOUNTER — Encounter: Payer: Self-pay | Admitting: Gynecology

## 2019-06-29 ENCOUNTER — Encounter: Payer: Self-pay | Admitting: Gynecology

## 2020-12-29 ENCOUNTER — Other Ambulatory Visit: Payer: Self-pay | Admitting: Family Medicine

## 2020-12-29 DIAGNOSIS — Z1231 Encounter for screening mammogram for malignant neoplasm of breast: Secondary | ICD-10-CM

## 2021-02-17 ENCOUNTER — Other Ambulatory Visit: Payer: Self-pay

## 2021-02-17 ENCOUNTER — Ambulatory Visit
Admission: RE | Admit: 2021-02-17 | Discharge: 2021-02-17 | Disposition: A | Discharge status: Home / Self Care | Payer: No Typology Code available for payment source | Source: Ambulatory Visit | Attending: Family Medicine | Admitting: Family Medicine

## 2021-02-17 DIAGNOSIS — Z1231 Encounter for screening mammogram for malignant neoplasm of breast: Secondary | ICD-10-CM

## 2021-02-19 ENCOUNTER — Ambulatory Visit: Payer: Managed Care, Other (non HMO)

## 2022-01-17 ENCOUNTER — Other Ambulatory Visit: Payer: Self-pay | Admitting: Family Medicine

## 2022-01-17 DIAGNOSIS — Z1231 Encounter for screening mammogram for malignant neoplasm of breast: Secondary | ICD-10-CM

## 2022-03-06 ENCOUNTER — Ambulatory Visit
Admission: RE | Admit: 2022-03-06 | Discharge: 2022-03-06 | Disposition: A | Discharge status: Home / Self Care | Payer: No Typology Code available for payment source | Source: Ambulatory Visit | Attending: Family Medicine | Admitting: Family Medicine

## 2022-03-06 DIAGNOSIS — Z1231 Encounter for screening mammogram for malignant neoplasm of breast: Secondary | ICD-10-CM

## 2022-04-05 IMAGING — MG MM DIGITAL SCREENING BILAT W/ TOMO AND CAD
6 of 10 series · 6 of 30 positions shown · non-contrast
Comparison: Previous exam(s).

CLINICAL DATA: Screening.

EXAM:
DIGITAL SCREENING BILATERAL MAMMOGRAM WITH TOMOSYNTHESIS AND CAD
TECHNIQUE: Bilateral screening digital craniocaudal and mediolateral oblique
mammograms were obtained. Bilateral screening digital breast
tomosynthesis was performed. The images were evaluated with
computer-aided detection.

[L MLO synth-2D]
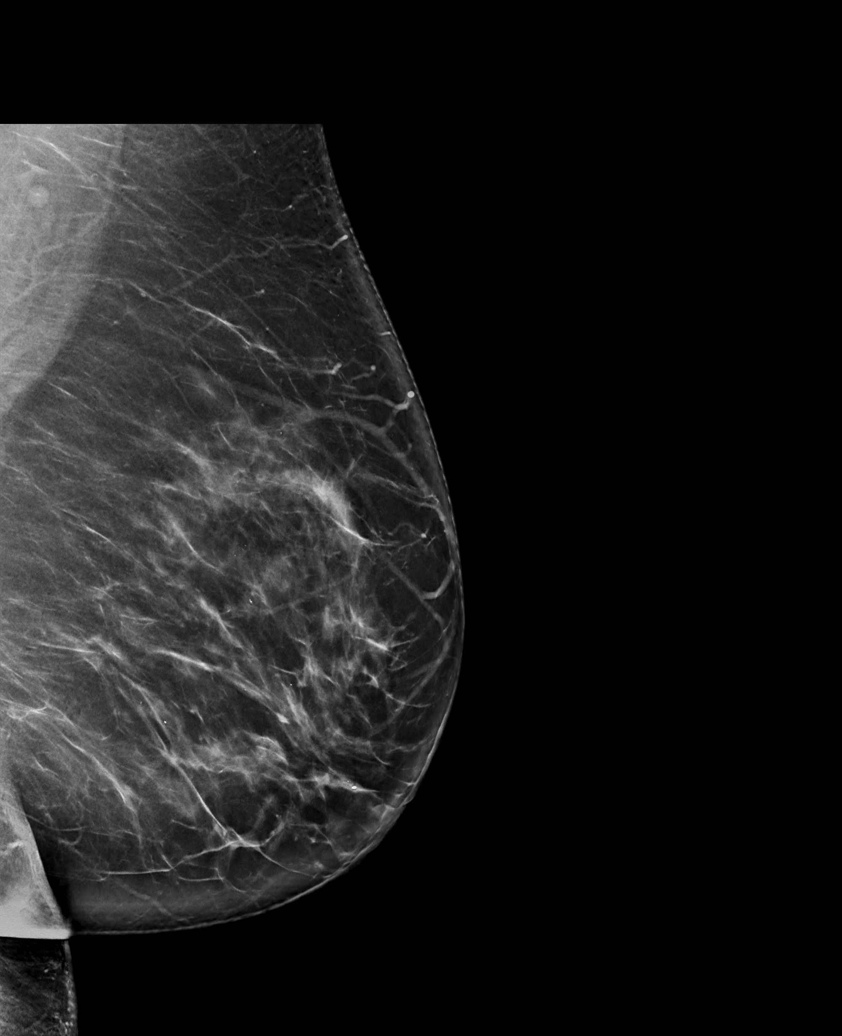

[R CV synth-2D]
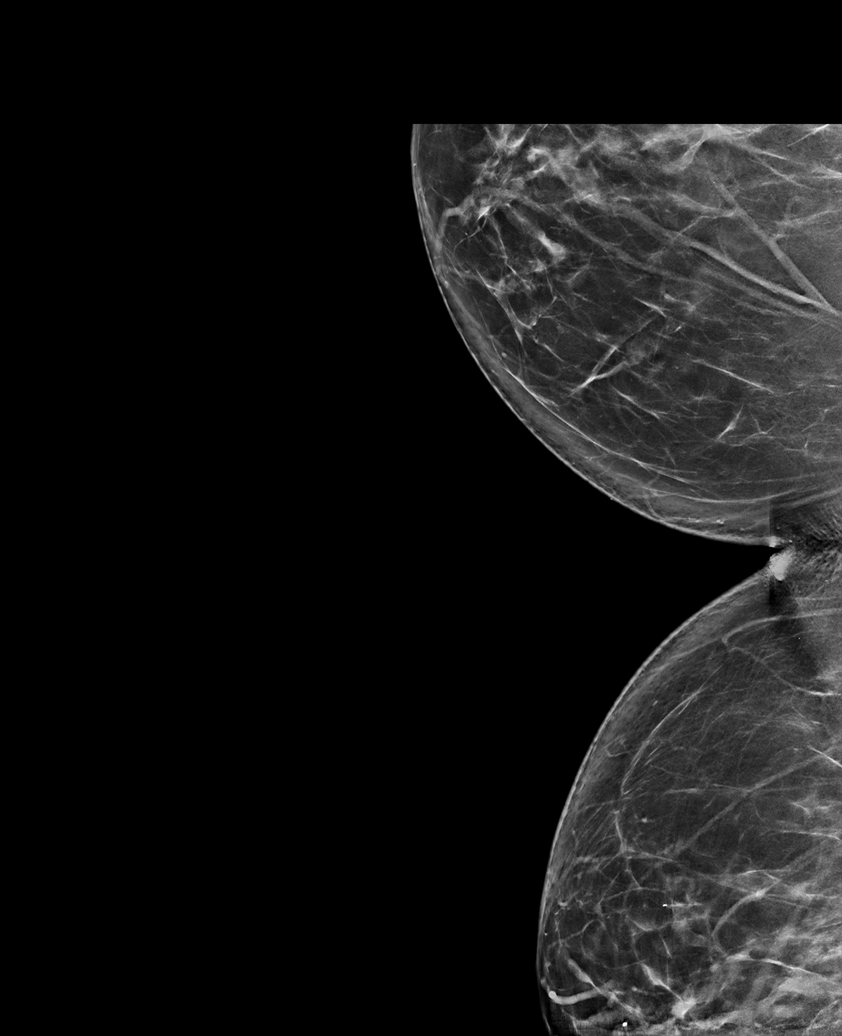

[L CC synth-2D]
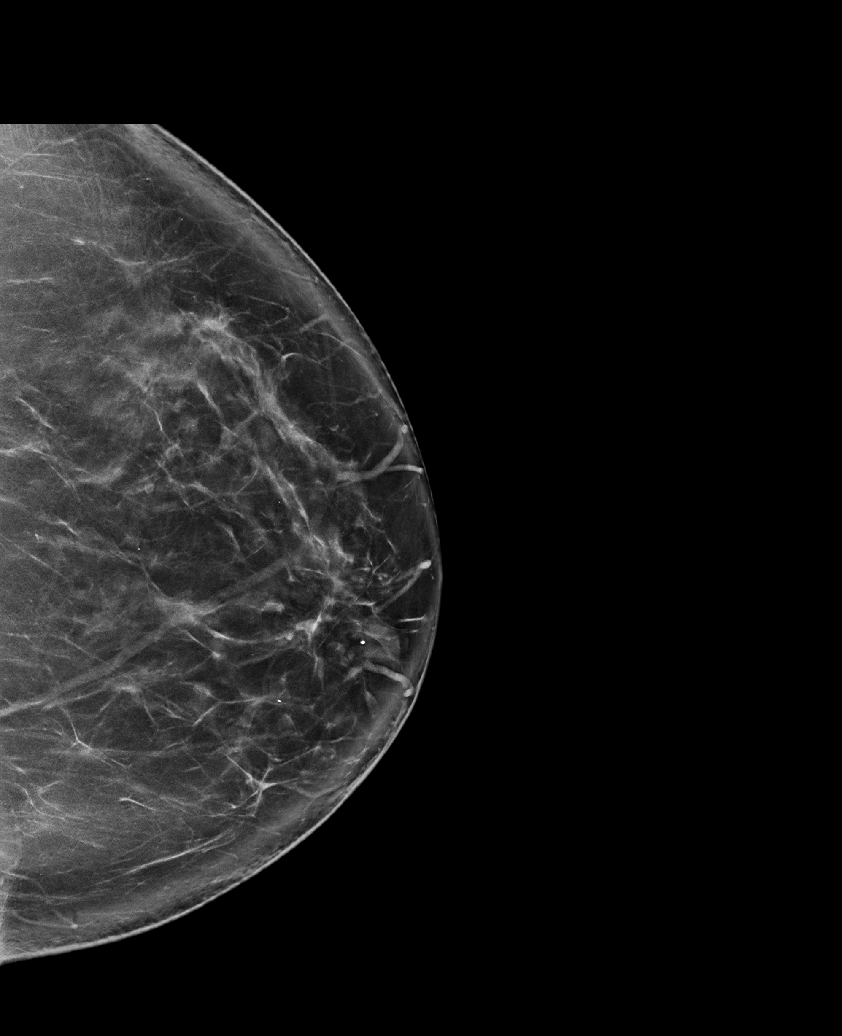

[R CC synth-2D]
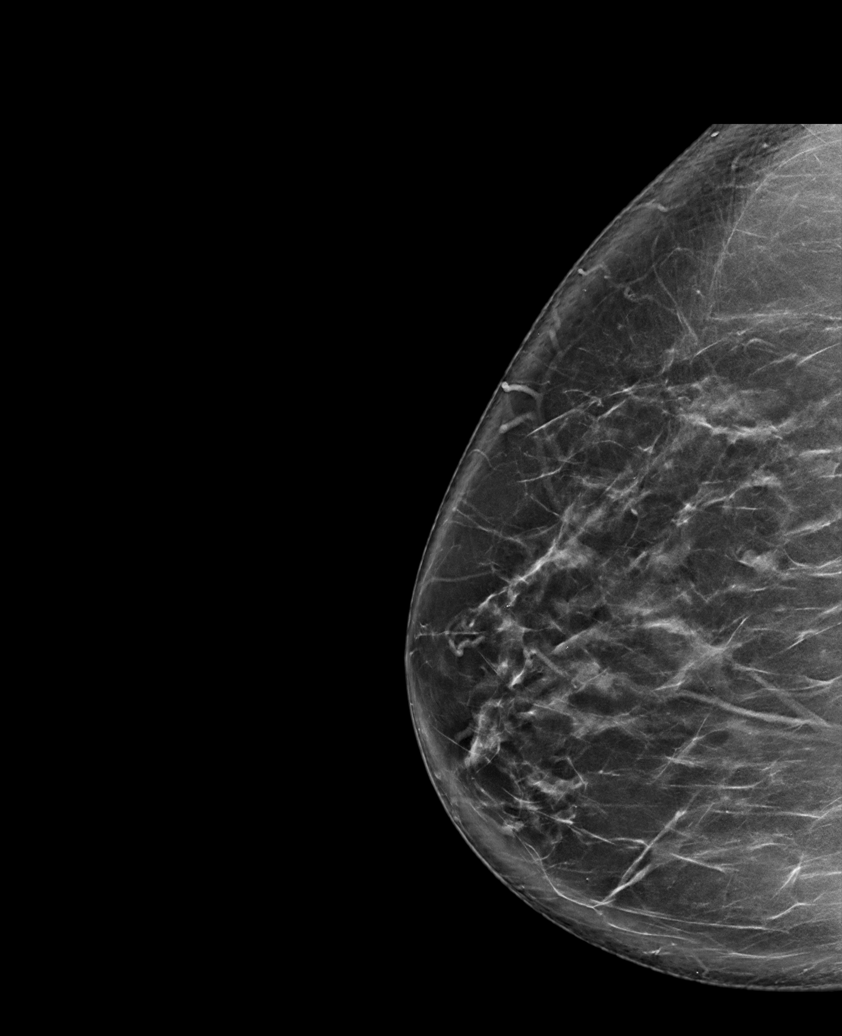

[R MLO synth-2D]
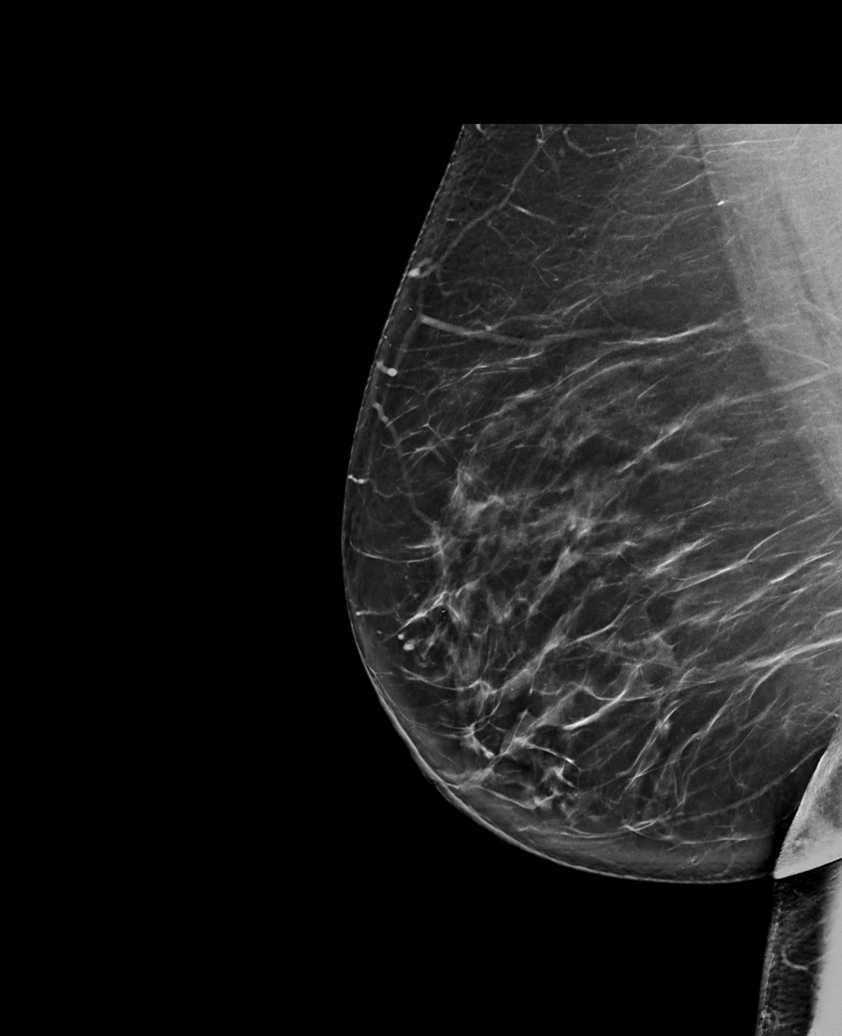

[R CV tomo · tomo slice 43/85.0]
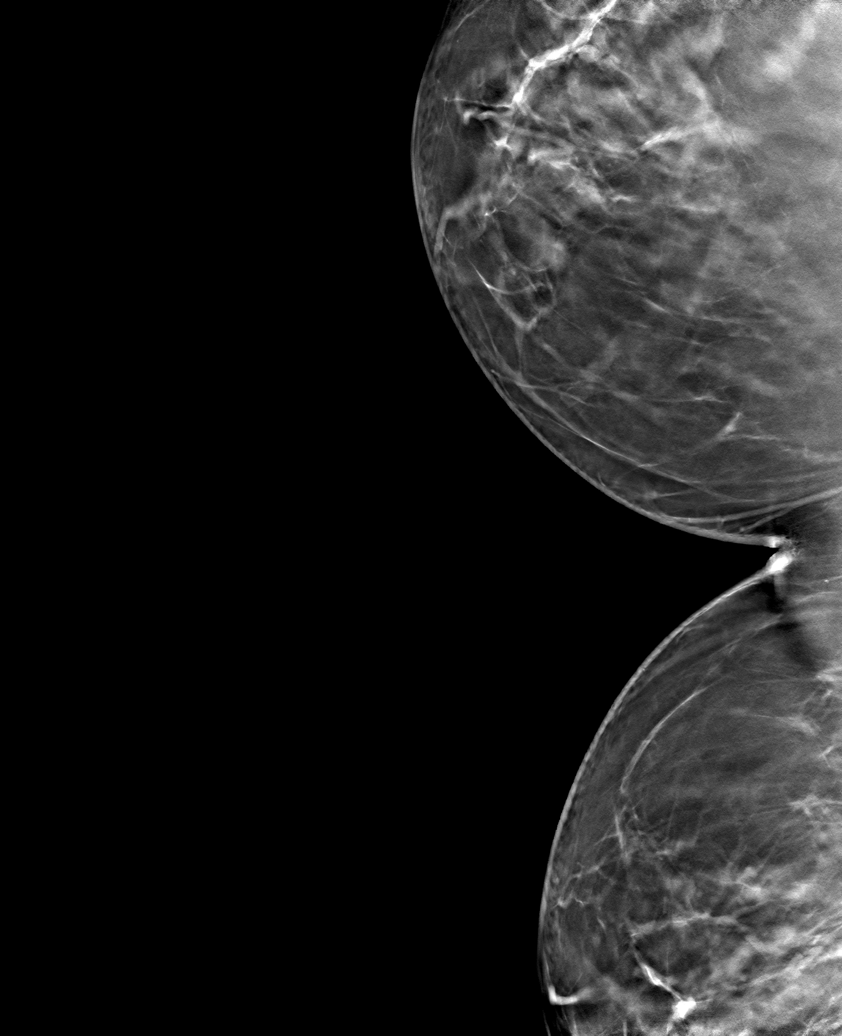

[6 of 30 positions shown; findings below may reference images not displayed]

ACR Breast Density Category b: There are scattered areas of
fibroglandular density.
FINDINGS: There are no findings suspicious for malignancy. The images were
evaluated with computer-aided detection.
IMPRESSION: No mammographic evidence of malignancy. A result letter of this
screening mammogram will be mailed directly to the patient.

RECOMMENDATION:
Screening mammogram in one year. (Code:[id number]-[id number])

BI-RADS CATEGORY  1: Negative.

## 2023-03-27 ENCOUNTER — Other Ambulatory Visit: Payer: Self-pay | Admitting: Family Medicine

## 2023-03-27 DIAGNOSIS — Z1231 Encounter for screening mammogram for malignant neoplasm of breast: Secondary | ICD-10-CM

## 2023-03-31 ENCOUNTER — Ambulatory Visit
Admission: RE | Admit: 2023-03-31 | Discharge: 2023-03-31 | Disposition: A | Discharge status: Home / Self Care | Payer: No Typology Code available for payment source | Source: Ambulatory Visit | Attending: Family Medicine | Admitting: Family Medicine

## 2023-03-31 DIAGNOSIS — Z1231 Encounter for screening mammogram for malignant neoplasm of breast: Secondary | ICD-10-CM

## 2023-12-19 DIAGNOSIS — Z85828 Personal history of other malignant neoplasm of skin: Secondary | ICD-10-CM | POA: Diagnosis not present

## 2023-12-19 DIAGNOSIS — Z08 Encounter for follow-up examination after completed treatment for malignant neoplasm: Secondary | ICD-10-CM | POA: Diagnosis not present

## 2024-02-09 ENCOUNTER — Other Ambulatory Visit: Payer: Self-pay | Admitting: Family Medicine

## 2024-02-09 DIAGNOSIS — Z6841 Body Mass Index (BMI) 40.0 and over, adult: Secondary | ICD-10-CM | POA: Diagnosis not present

## 2024-02-09 DIAGNOSIS — E2839 Other primary ovarian failure: Secondary | ICD-10-CM

## 2024-02-09 DIAGNOSIS — F329 Major depressive disorder, single episode, unspecified: Secondary | ICD-10-CM | POA: Diagnosis not present

## 2024-02-09 DIAGNOSIS — Z0000 Encounter for general adult medical examination without abnormal findings: Secondary | ICD-10-CM | POA: Diagnosis not present

## 2024-02-09 DIAGNOSIS — Z23 Encounter for immunization: Secondary | ICD-10-CM | POA: Diagnosis not present

## 2024-02-09 DIAGNOSIS — R7303 Prediabetes: Secondary | ICD-10-CM | POA: Diagnosis not present

## 2024-02-09 DIAGNOSIS — E559 Vitamin D deficiency, unspecified: Secondary | ICD-10-CM | POA: Diagnosis not present

## 2024-03-22 ENCOUNTER — Other Ambulatory Visit: Payer: Self-pay | Admitting: Family Medicine

## 2024-03-22 DIAGNOSIS — Z1231 Encounter for screening mammogram for malignant neoplasm of breast: Secondary | ICD-10-CM

## 2024-03-31 ENCOUNTER — Ambulatory Visit
Admission: RE | Admit: 2024-03-31 | Discharge: 2024-03-31 | Disposition: A | Discharge status: Home / Self Care | Source: Ambulatory Visit | Attending: Family Medicine | Admitting: Family Medicine

## 2024-03-31 DIAGNOSIS — Z1231 Encounter for screening mammogram for malignant neoplasm of breast: Secondary | ICD-10-CM

## 2024-05-14 ENCOUNTER — Ambulatory Visit (HOSPITAL_BASED_OUTPATIENT_CLINIC_OR_DEPARTMENT_OTHER)
Admission: RE | Admit: 2024-05-14 | Discharge: 2024-05-14 | Disposition: A | Discharge status: Home / Self Care | Payer: Self-pay | Source: Ambulatory Visit | Attending: Family Medicine | Admitting: Family Medicine

## 2024-05-14 DIAGNOSIS — Z780 Asymptomatic menopausal state: Secondary | ICD-10-CM | POA: Diagnosis not present

## 2024-05-14 DIAGNOSIS — E2839 Other primary ovarian failure: Secondary | ICD-10-CM | POA: Diagnosis not present

## 2024-06-29 DIAGNOSIS — F329 Major depressive disorder, single episode, unspecified: Secondary | ICD-10-CM | POA: Diagnosis not present

## 2024-07-30 DIAGNOSIS — F329 Major depressive disorder, single episode, unspecified: Secondary | ICD-10-CM | POA: Diagnosis not present

## 2024-08-03 DIAGNOSIS — L570 Actinic keratosis: Secondary | ICD-10-CM | POA: Diagnosis not present

## 2024-08-03 DIAGNOSIS — D225 Melanocytic nevi of trunk: Secondary | ICD-10-CM | POA: Diagnosis not present

## 2024-08-03 DIAGNOSIS — L821 Other seborrheic keratosis: Secondary | ICD-10-CM | POA: Diagnosis not present

## 2024-08-03 DIAGNOSIS — Z1283 Encounter for screening for malignant neoplasm of skin: Secondary | ICD-10-CM | POA: Diagnosis not present

## 2024-08-03 DIAGNOSIS — X32XXXD Exposure to sunlight, subsequent encounter: Secondary | ICD-10-CM | POA: Diagnosis not present

## 2024-08-29 DIAGNOSIS — F329 Major depressive disorder, single episode, unspecified: Secondary | ICD-10-CM | POA: Diagnosis not present
